# Patient Record
Sex: Male | Born: 2005 | Race: White | Hispanic: No | Marital: Single | Smoking: Never smoker
Health system: Southern US, Community
[De-identification: ages and names within clinical notes are randomized; demographics above are authoritative.]

## PROBLEM LIST (undated history)

## (undated) DIAGNOSIS — S83209A Unspecified tear of unspecified meniscus, current injury, unspecified knee, initial encounter: Secondary | ICD-10-CM

---
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
## 2023-01-17 IMAGING — DX DG KNEE COMPLETE 4+V*L*
4 series · 4 of 4 positions shown · non-contrast
Comparison: None.

CLINICAL DATA: Football injury, left knee pain

EXAM:
LEFT KNEE - COMPLETE 4+ VIEW

[knee ap]
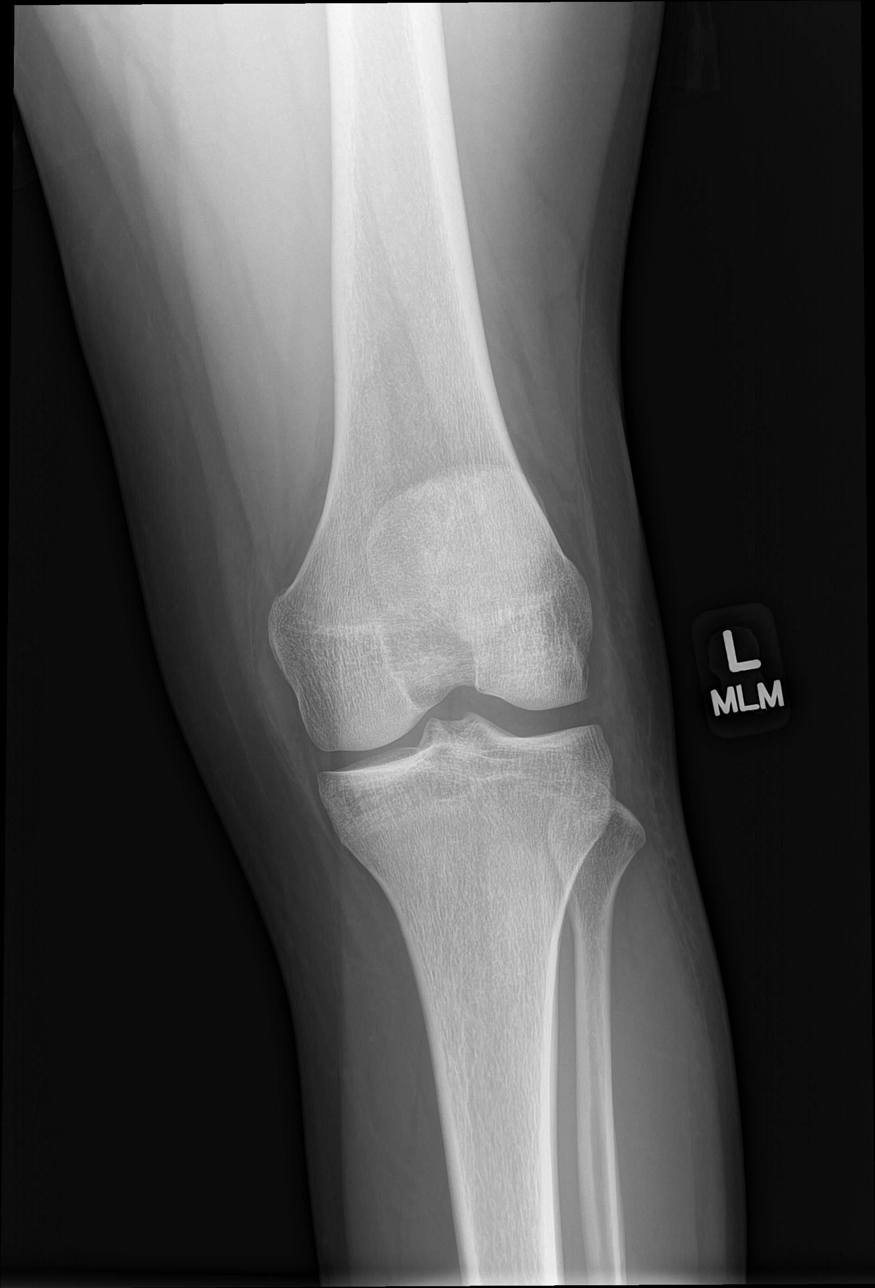

[knee lat]
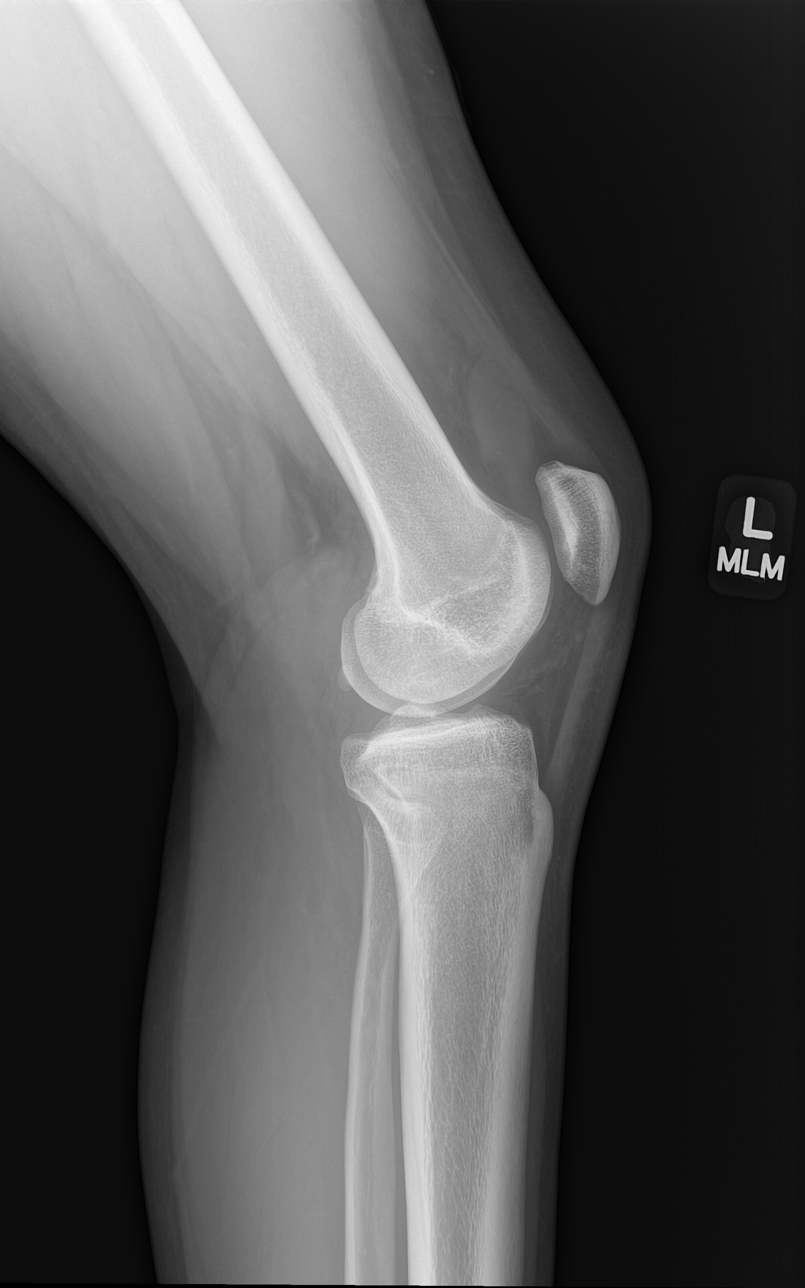

[patella skyline]
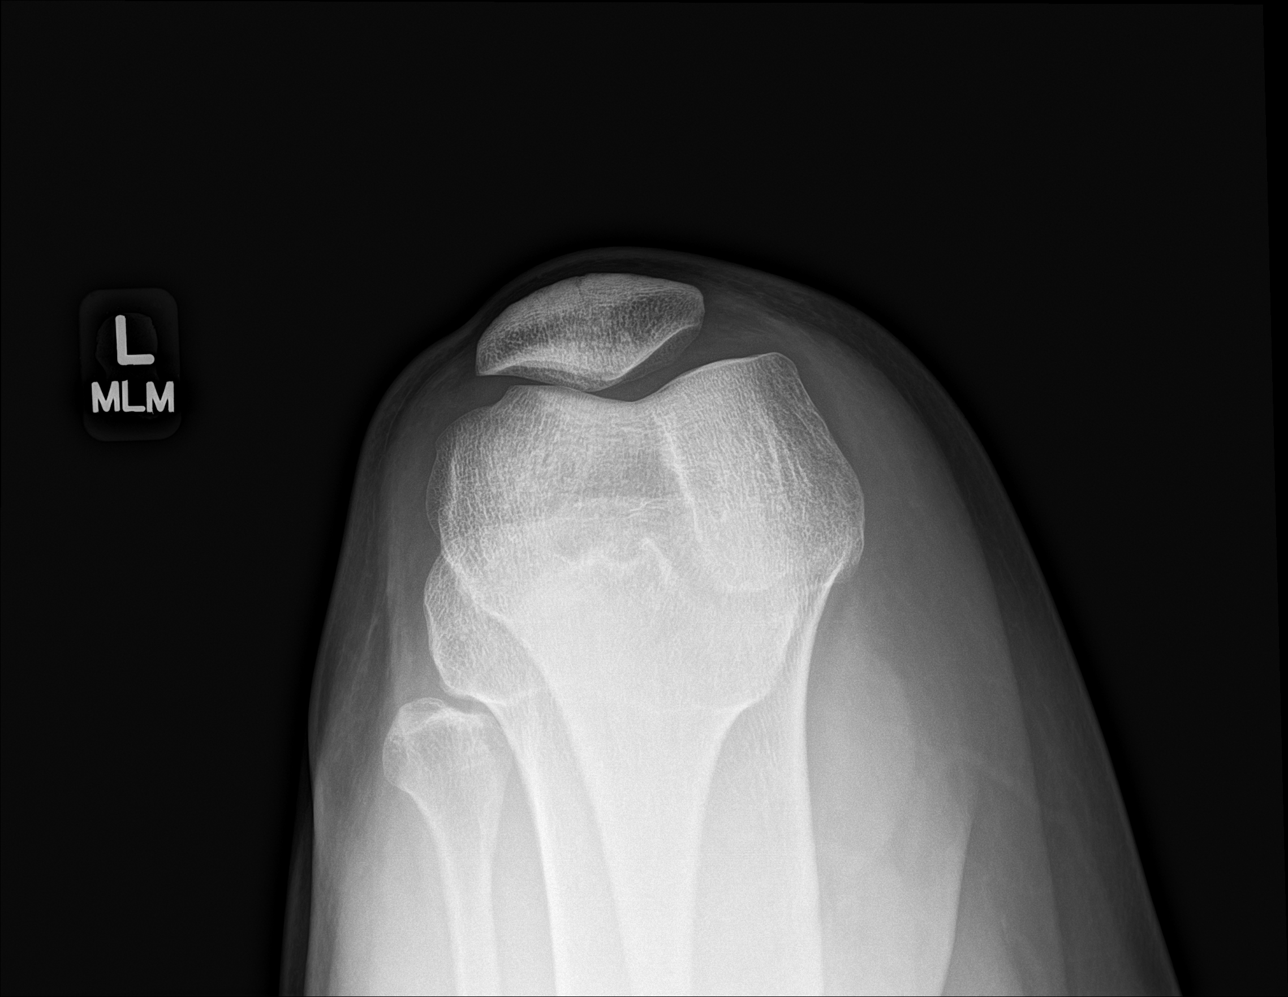

[knee tunnel]
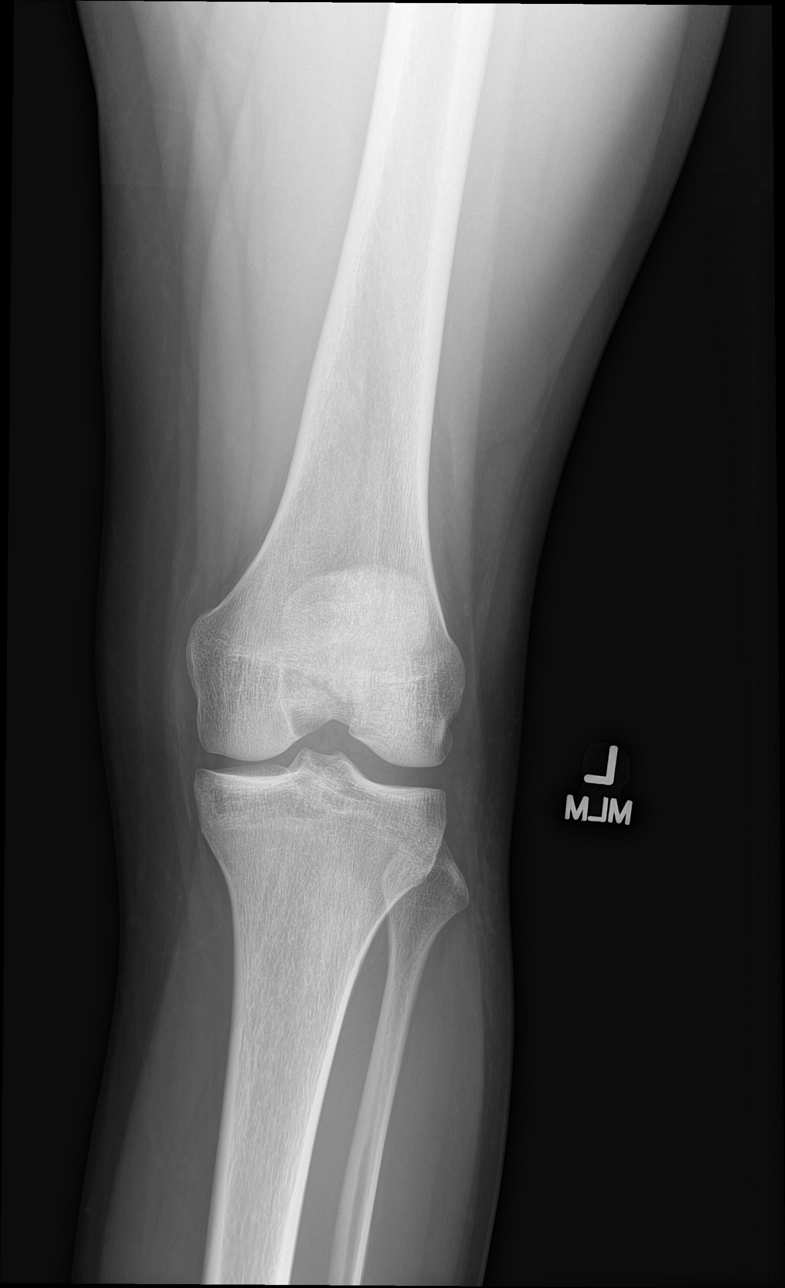

[4 of 4 positions shown; findings below may reference images not displayed]

FINDINGS: Normal alignment. No fracture or dislocation. Joint spaces are not
optimally profiled in the case of the patellofemoral joint, but
appear preserved. Small left knee effusion is present. Soft tissues
are otherwise unremarkable.
IMPRESSION: No fracture or dislocation.  Small left knee effusion.

## 2023-01-18 IMAGING — MR MR KNEE*L* W/O CM
4 of 7 series · 23 of 40 positions shown · non-contrast
Comparison: X-ray 12/06/2020

CLINICAL DATA: Left knee pain after injury playing football four
days ago

EXAM:
MRI OF THE LEFT KNEE WITHOUT CONTRAST
TECHNIQUE: Multiplanar, multisequence MR imaging of the knee was performed. No
intravenous contrast was administered.

[Series 6: T2 fat-sat · coronal · 4.0mm · 0.59mm/px · 6 of 27 slices shown (1 of 2)]
[im 1/27]
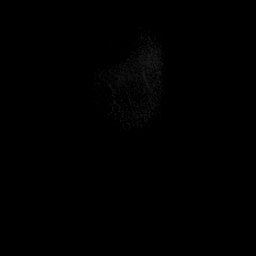
[im 6/27]
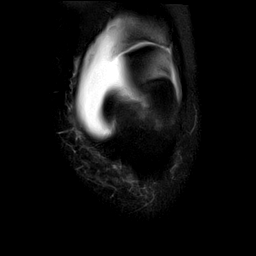
[im 11/27]
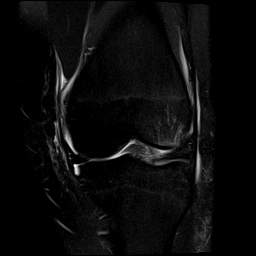
[im 16/27]
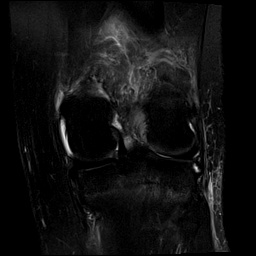
[im 21/27]
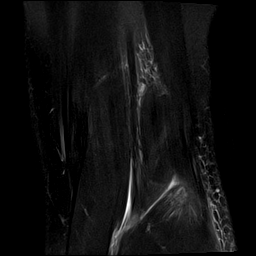
[im 27/27]
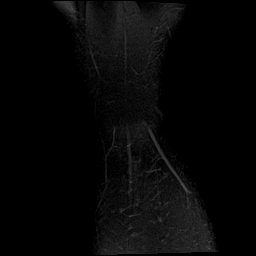

[Series 7: T1 · coronal · 4.0mm · 0.29mm/px · 5 of 29 slices shown]
[im 1/29]
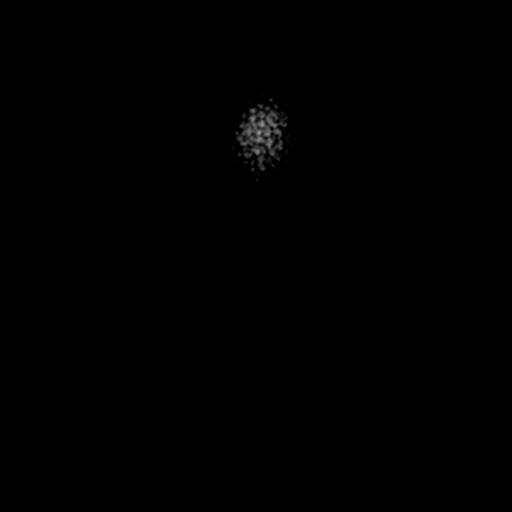
[im 6/29]
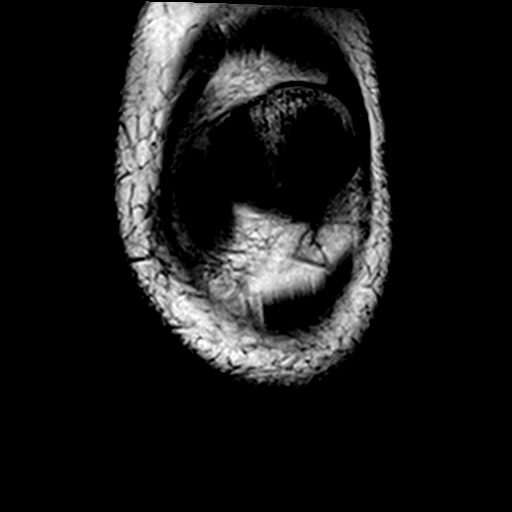
[im 12/29]
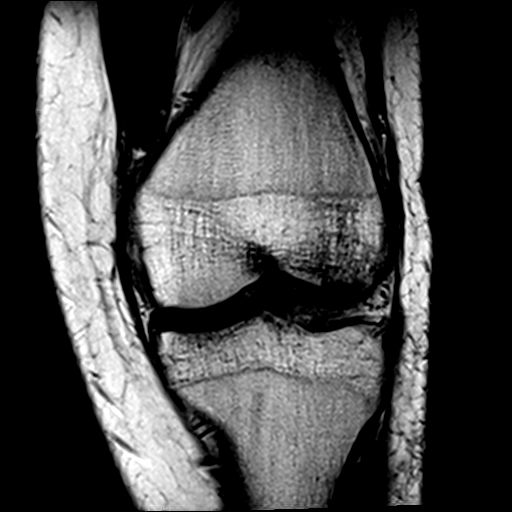
[im 17/29]
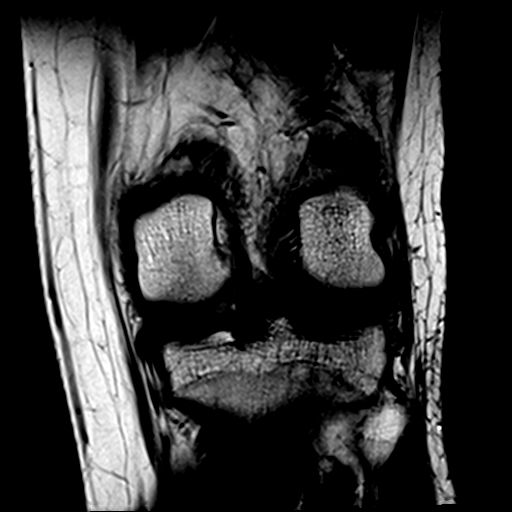
[im 29/29]
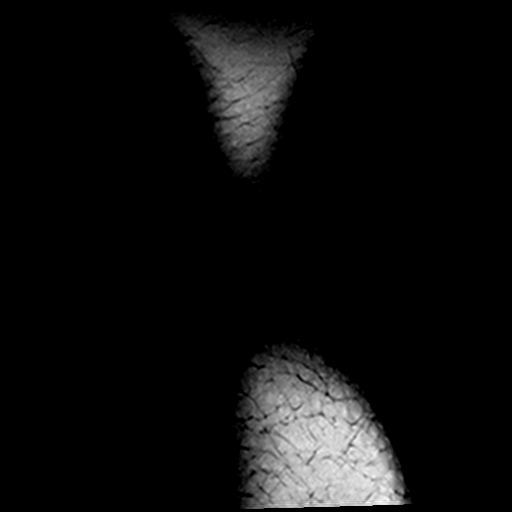

[Series 8: T2 fat-sat · sagittal · 3.0mm · 0.29mm/px · 6 of 30 slices shown (2 of 2)]
[im 1/30]
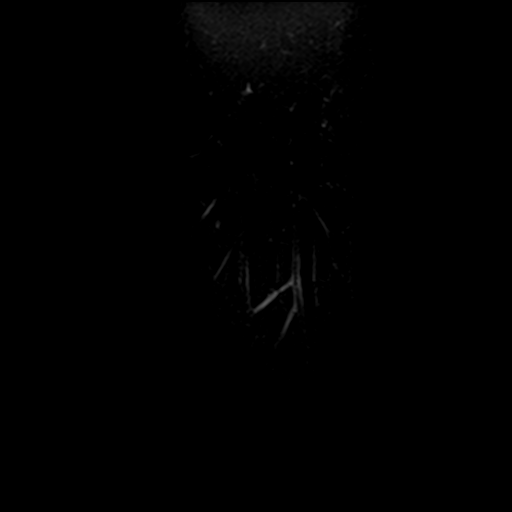
[im 6/30]
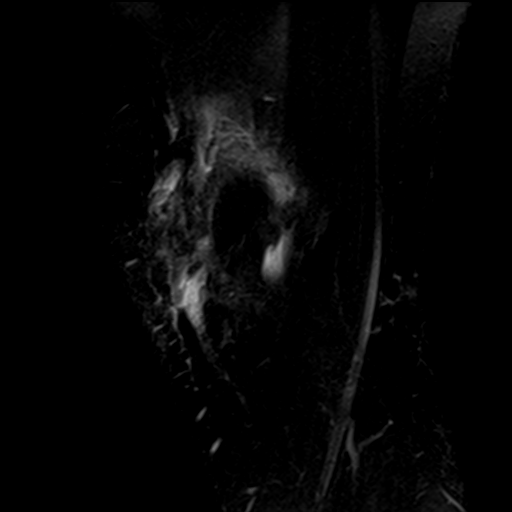
[im 12/30]
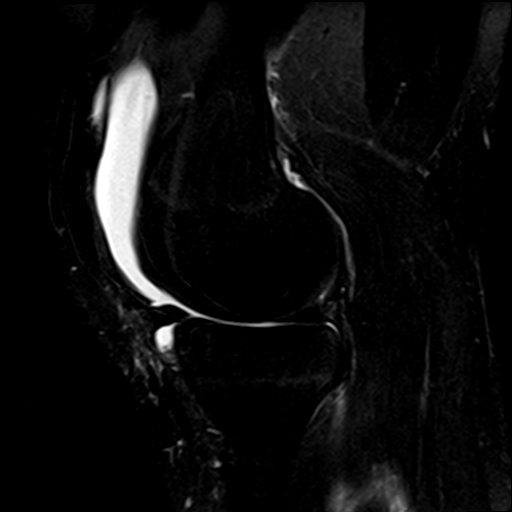
[im 18/30]
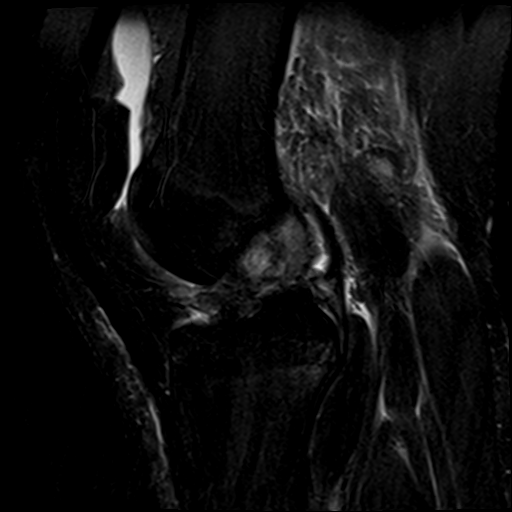
[im 24/30]
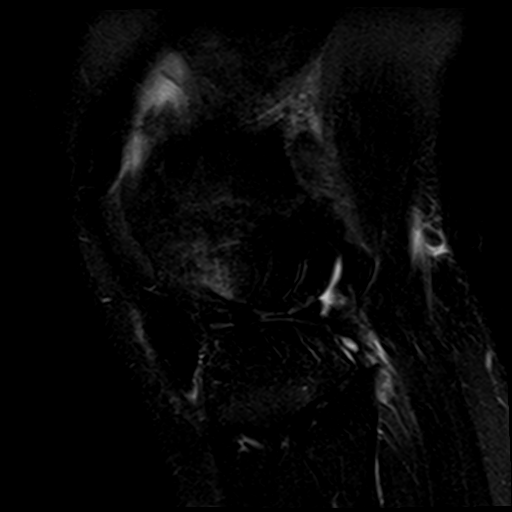
[im 30/30]
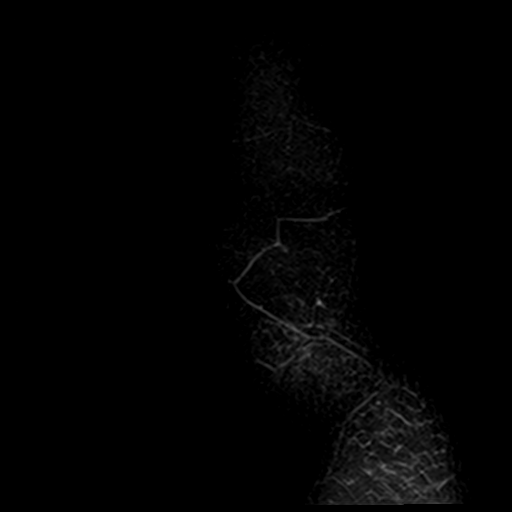

[Series 10: PD fat-sat · sagittal · 3.0mm · 0.29mm/px · 6 of 30 slices shown]
[im 1/30]
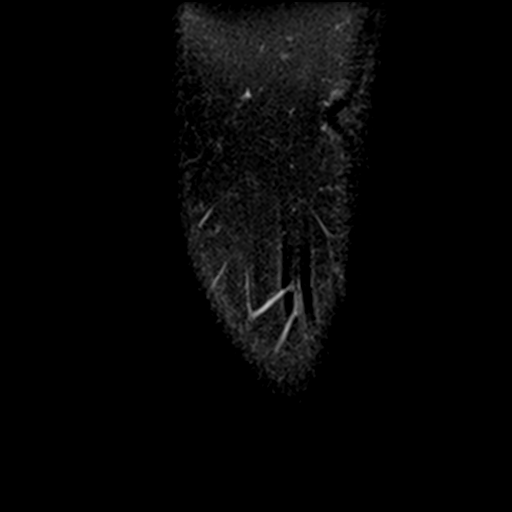
[im 6/30]
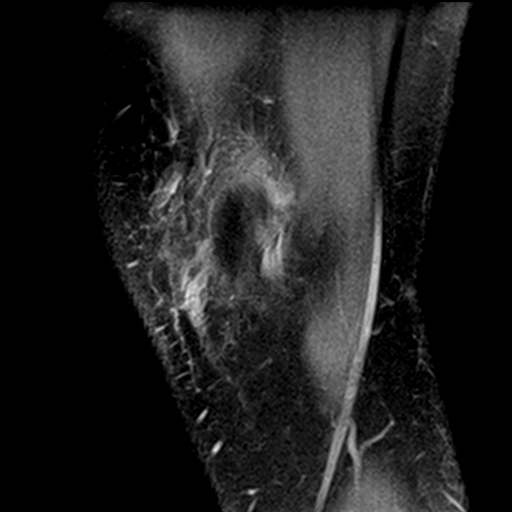
[im 12/30]
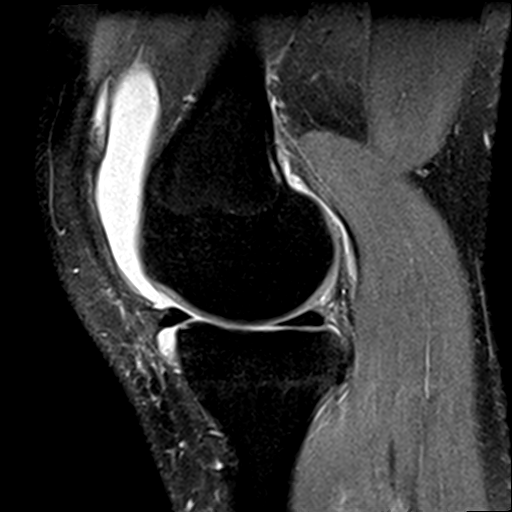
[im 18/30]
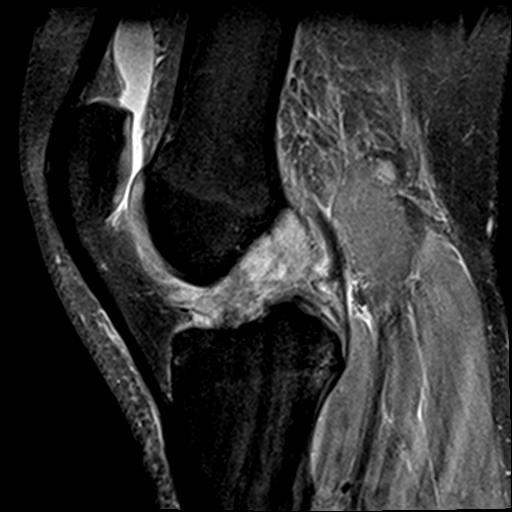
[im 24/30]
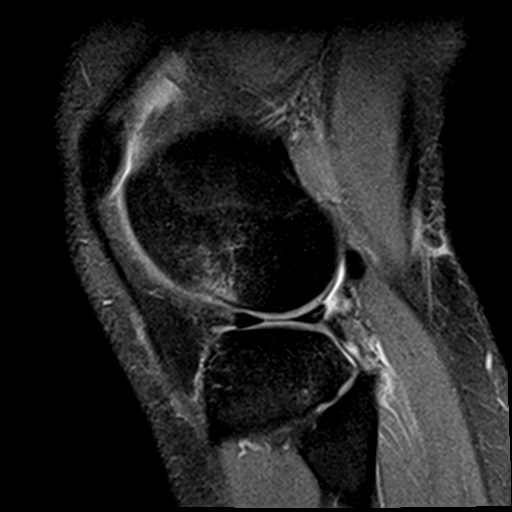
[im 30/30]
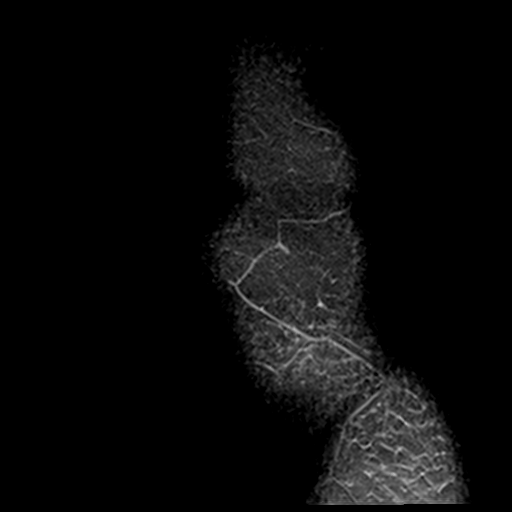

[23 of 40 positions shown; findings below may reference images not displayed]

FINDINGS: Technical Note: Despite efforts by the technologist and patient,
motion artifact is present on today's exam and could not be
eliminated. This reduces exam sensitivity and specificity.

MENISCI

Medial meniscus:  Intact.

Lateral meniscus: Irregular tear involving the peripheral aspect of
the lateral meniscal posterior horn propagating from the

LIGAMENTS

Cruciates:  Complete rupture of the ACL.  Intact PCL.

Collaterals: Intact MCL with periligamentous edema. Lateral
collateral ligament complex intact.

CARTILAGE

Patellofemoral:  No chondral defect.

Medial:  No chondral defect.

Lateral:  No chondral defect.

Joint: Moderate-large knee joint effusion. Fat pads within normal
limits.

Popliteal Fossa:  No Baker cyst. Intact popliteus tendon.

Extensor Mechanism:  Intact quadriceps tendon and patellar tendon.

Bones: Small focal bone contusions at the peripheral aspect of the
lateral femoral condyle and posterior aspect of the lateral tibial
plateau. No fracture. No marrow edema within the fibular head. No
suspicious bone lesion.

Other: Mild intramuscular edema within the soleus. There is edema
tracking along the fascial layers of the visualized calf
musculature. Periarticular soft tissue edema. Subcutaneous edema
with ill-defined fluid at the anterior and lateral aspects of the
knee. No well-defined hematoma.
IMPRESSION: 1. Complete ACL tear with associated pivot-shift bone contusions.
2. Irregular tear involving the lateral meniscal posterior horn.
3. Grade 1 MCL sprain.
4. Moderate-large knee joint effusion.
5. Posttraumatic soft tissue edema about the knee.
